# Patient Record
Sex: Female | Born: 1995 | Race: White | Hispanic: No | Marital: Single | Smoking: Former smoker
Health system: Southern US, Community
[De-identification: ages and names within clinical notes are randomized; demographics above are authoritative.]

## PROBLEM LIST (undated history)

## (undated) DIAGNOSIS — E079 Disorder of thyroid, unspecified: Secondary | ICD-10-CM

## (undated) DIAGNOSIS — Z5189 Encounter for other specified aftercare: Secondary | ICD-10-CM

## (undated) HISTORY — PX: CHOLECYSTECTOMY: SHX55

---

## 2019-09-03 ENCOUNTER — Emergency Department (HOSPITAL_COMMUNITY): Payer: Self-pay

## 2019-09-03 ENCOUNTER — Encounter (HOSPITAL_COMMUNITY): Payer: Self-pay | Admitting: *Deleted

## 2019-09-03 ENCOUNTER — Other Ambulatory Visit: Payer: Self-pay

## 2019-09-03 ENCOUNTER — Emergency Department (HOSPITAL_COMMUNITY)
Admission: EM | Admit: 2019-09-03 | Discharge: 2019-09-03 | Disposition: A | Discharge status: Home / Self Care | Payer: Self-pay | Attending: Emergency Medicine | Admitting: Emergency Medicine

## 2019-09-03 DIAGNOSIS — Z87891 Personal history of nicotine dependence: Secondary | ICD-10-CM | POA: Insufficient documentation

## 2019-09-03 DIAGNOSIS — L089 Local infection of the skin and subcutaneous tissue, unspecified: Secondary | ICD-10-CM | POA: Insufficient documentation

## 2019-09-03 DIAGNOSIS — Y998 Other external cause status: Secondary | ICD-10-CM | POA: Insufficient documentation

## 2019-09-03 DIAGNOSIS — Y9389 Activity, other specified: Secondary | ICD-10-CM | POA: Insufficient documentation

## 2019-09-03 DIAGNOSIS — S93402A Sprain of unspecified ligament of left ankle, initial encounter: Secondary | ICD-10-CM | POA: Insufficient documentation

## 2019-09-03 DIAGNOSIS — X58XXXA Exposure to other specified factors, initial encounter: Secondary | ICD-10-CM | POA: Insufficient documentation

## 2019-09-03 DIAGNOSIS — Y9289 Other specified places as the place of occurrence of the external cause: Secondary | ICD-10-CM | POA: Insufficient documentation

## 2019-09-03 HISTORY — DX: Disorder of thyroid, unspecified: E07.9

## 2019-09-03 HISTORY — DX: Encounter for other specified aftercare: Z51.89

## 2019-09-03 MED ORDER — DOXYCYCLINE HYCLATE 100 MG PO CAPS: 100.0000 mg | ORAL_CAPSULE | Freq: Two times a day (BID) | ORAL | 0 refills | Status: AC

## 2019-09-03 NOTE — ED Provider Notes (Signed)
[facility] EMERGENCY DEPARTMENT Provider Note   CSN: [id number] Arrival date & time: 09/03/19  1527     History Chief Complaint  Patient presents with  . Ankle Pain    Martha Adams is a 24 y.o. female.  The history is provided by the patient. No language interpreter was used.  Ankle Pain Location:  Ankle Time since incident:  4 days Injury: yes   Ankle location:  L ankle Pain details:    Quality:  Aching   Radiates to:  Does not radiate   Severity:  Moderate   Onset quality:  Gradual   Duration:  4 days   Timing:  Constant   Progression:  Worsening Chronicity:  New Dislocation: no   Foreign body present:  No foreign bodies Relieved by:  Nothing Worsened by:  Nothing Ineffective treatments:  None tried Risk factors: no concern for non-accidental trauma   Pt also reports her nail came out from her nail bed and is now swollen      Past Medical History:  Diagnosis Date  . Blood transfusion without reported diagnosis   . Thyroid disease     There are no problems to display for this patient.   Past Surgical History:  Procedure Laterality Date  . CESAREAN SECTION    . CHOLECYSTECTOMY       OB History   No obstetric history on file.     History reviewed. No pertinent family history.  Social History   Tobacco Use  . Smoking status: Former Games developer  . Smokeless tobacco: Never Used  Substance Use Topics  . Alcohol use: Yes  . Drug use: Yes    Types: Marijuana    Home Medications Prior to Admission medications   Not on File    Allergies    Contrast media [iodinated diagnostic agents] and Latex  Review of Systems   Review of Systems  Musculoskeletal: Positive for arthralgias and myalgias.  Skin: Positive for color change.  All other systems reviewed and are negative.   Physical Exam Updated Vital Signs BP 129/60 (BP Location: Right Arm)   Pulse 94   Temp 98.4 F (36.9 C) (Oral)   Resp 16   Ht 5\' 1"  (1.549 m)   Wt 120.2 kg   LMP  08/08/2019   SpO2 97%   BMI 50.07 kg/m   Physical Exam Vitals and nursing note reviewed.  Constitutional:      Appearance: She is well-developed.  HENT:     Head: Normocephalic.  Pulmonary:     Effort: Pulmonary effort is normal.  Abdominal:     General: There is no distension.  Musculoskeletal:        General: Swelling, tenderness and signs of injury present.     Cervical back: Normal range of motion.  Skin:    General: Skin is warm.  Neurological:     General: No focal deficit present.     Mental Status: She is alert and oriented to person, place, and time.  Psychiatric:        Mood and Affect: Mood normal.     ED Results / Procedures / Treatments   Labs (all labs ordered are listed, but only abnormal results are displayed) Labs Reviewed - No data to display  EKG None  Radiology DG Ankle Complete Left  Result Date: 09/03/2019 CLINICAL DATA:  Pain and swelling following twisting injury EXAM: LEFT ANKLE COMPLETE - 3+ VIEW COMPARISON:  None. FINDINGS: Frontal, oblique, and lateral views were obtained. There is  generalized soft tissue swelling. No evident fracture or joint effusion. The joint spaces appear normal. There is a minimal inferior calcaneal spur. Ankle mortise appears intact. IMPRESSION: Soft tissue swelling. No evident fracture. Minimal inferior calcaneal spur. Ankle mortise appears intact. Electronically Signed   By: Lowella Grip III M.D.   On: 09/03/2019 16:13    Procedures Procedures (including critical care time)  Medications Ordered in ED Medications - No data to display  ED Course  I have reviewed the triage vital signs and the nursing notes.  Pertinent labs & imaging results that were available during my care of the patient were reviewed by me and considered in my medical decision making (see chart for details).    MDM Rules/Calculators/A&P                      MDM: Xray shows no fracture,  Pt does not want crutches.  Pt placed in a cam  walker. Pt given rx for doxycycline   Final Clinical Impression(s) / ED Diagnoses Final diagnoses:  Sprain of left ankle, unspecified ligament, initial encounter  Finger infection    Rx / DC Orders ED Discharge Orders         Ordered    doxycycline (VIBRAMYCIN) 100 MG capsule  2 times daily     09/03/19 1807        An After Visit Summary was printed and given to the patient.    Fransico Meadow, [place] 09/03/19 1815    Margette Fast, MD 09/03/19 [phone]

## 2019-09-03 NOTE — Discharge Instructions (Addendum)
See your Physician for recheck.  Take a baby asa for the next 3 days while traveling and immobilized.  Tylenol for pain.Soak finger 20 minutes 4 times a day

## 2019-09-03 NOTE — ED Triage Notes (Signed)
Pt rolled her left ankle 4 days ago, ankle swollen and bruised. Pt was able to ambulate to triage room.

## 2021-04-03 IMAGING — DX DG ANKLE COMPLETE 3+V*L*
3 series · 3 of 3 positions shown · non-contrast
Comparison: None.

CLINICAL DATA: Pain and swelling following twisting injury

EXAM:
LEFT ANKLE COMPLETE - 3+ VIEW

[ankle ap]
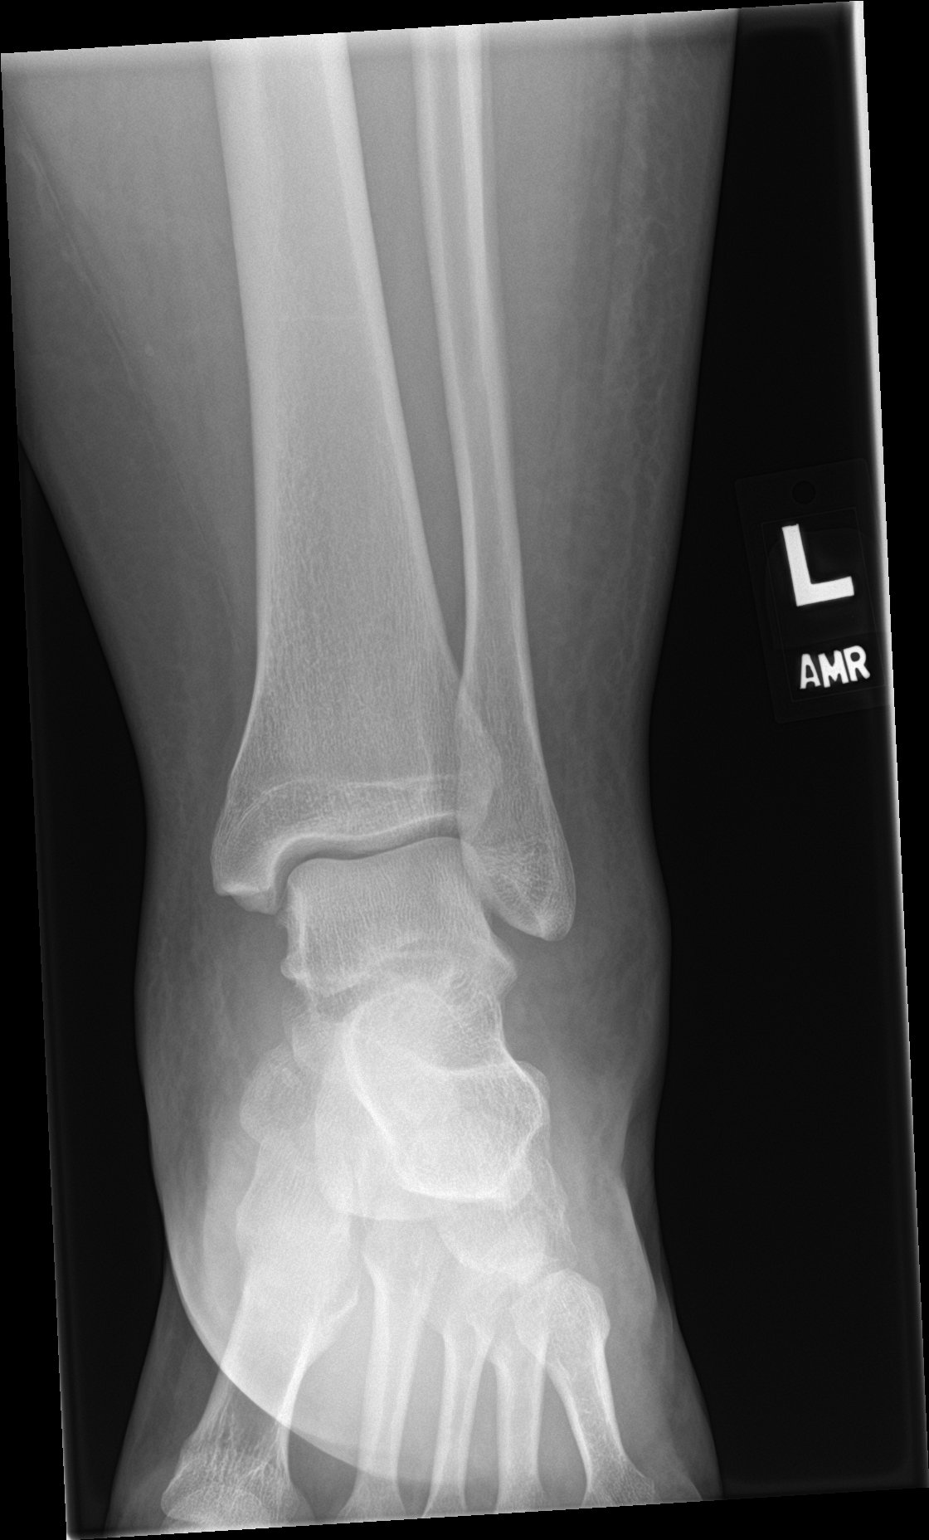

[ankle obl]
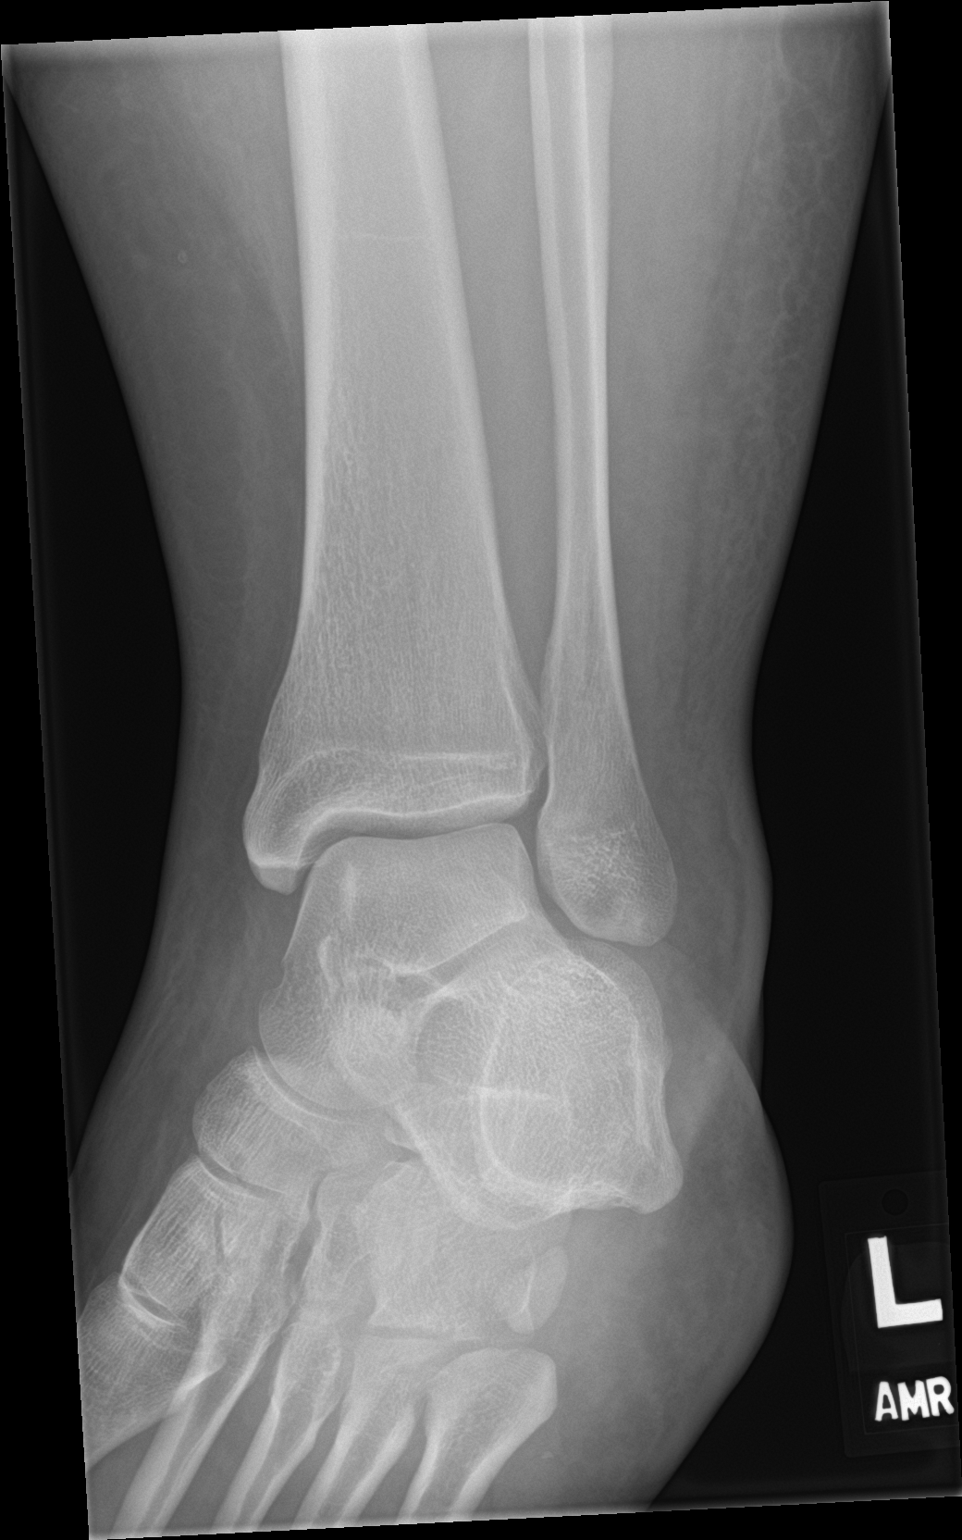

[ankle lat]
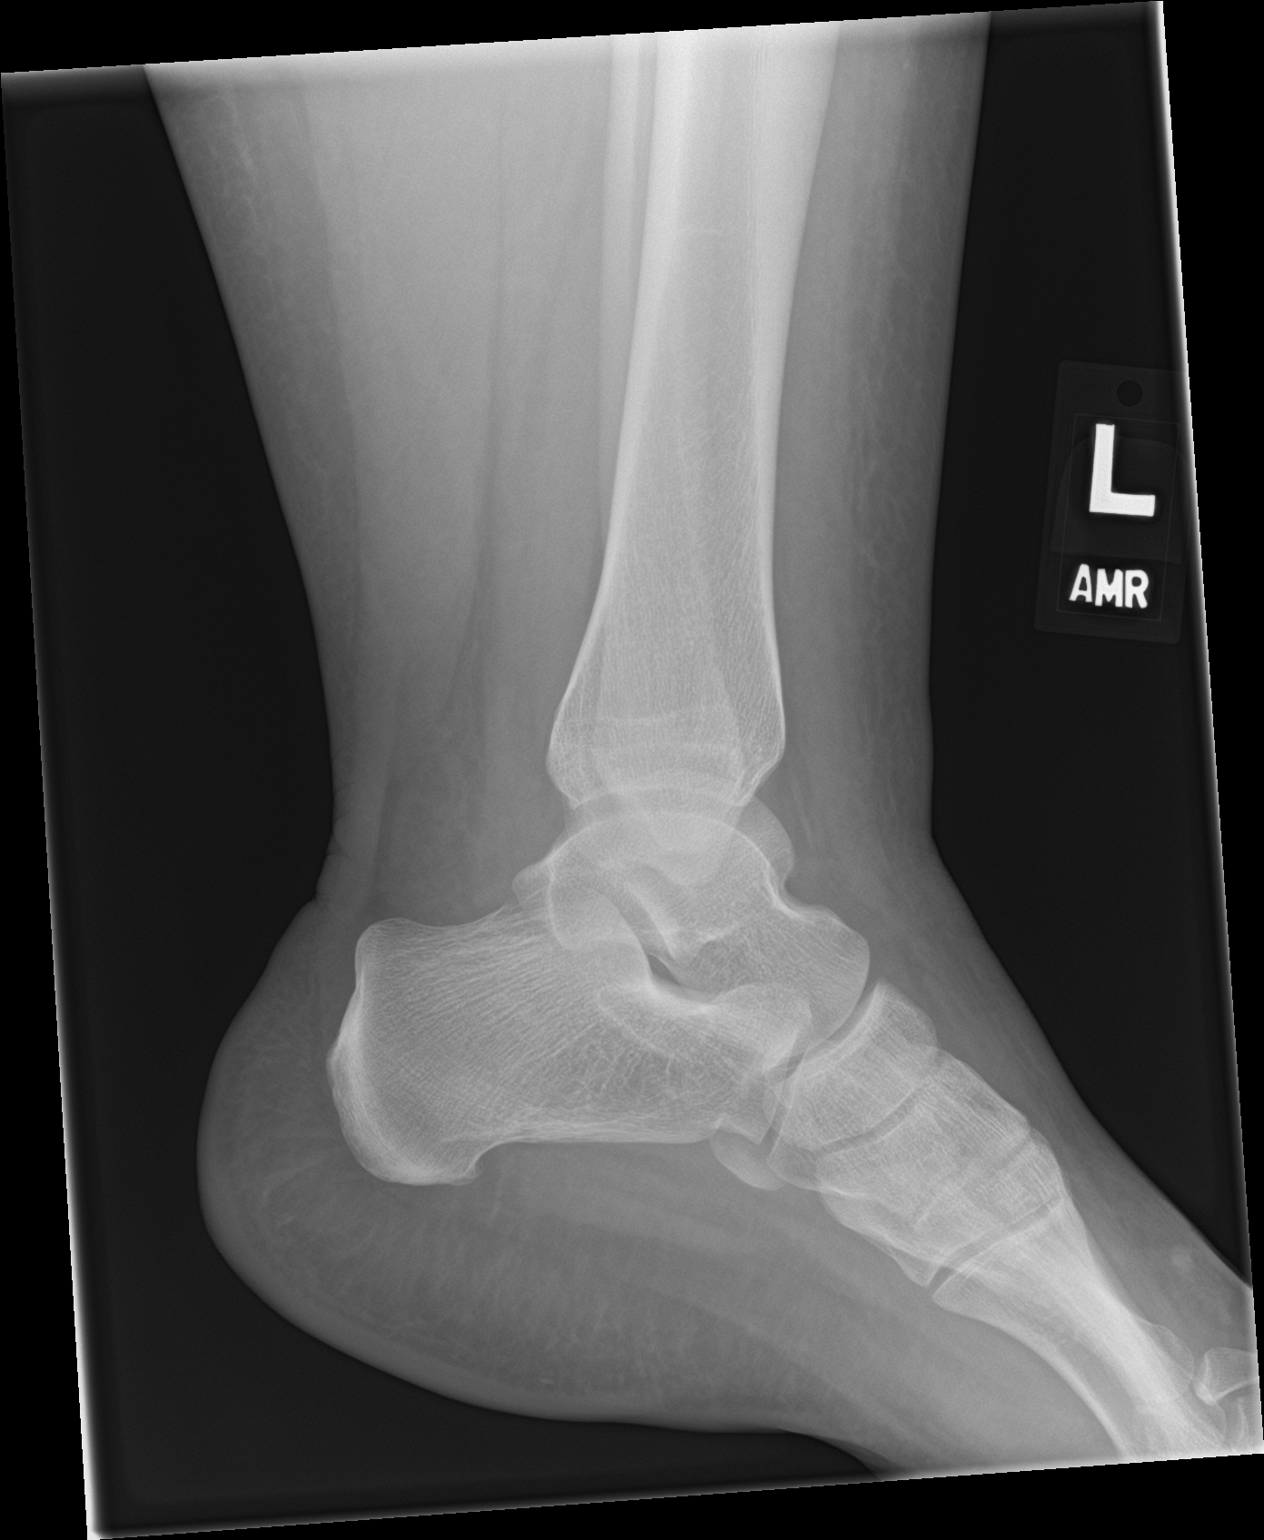

[3 of 3 positions shown; findings below may reference images not displayed]

FINDINGS: Frontal, oblique, and lateral views were obtained. There is
generalized soft tissue swelling. No evident fracture or joint
effusion. The joint spaces appear normal. There is a minimal
inferior calcaneal spur. Ankle mortise appears intact.
IMPRESSION: Soft tissue swelling. No evident fracture. Minimal inferior
calcaneal spur. Ankle mortise appears intact.
